# Patient Record
Sex: Male | Born: 1990 | Race: White | Hispanic: No | Marital: Single
Health system: Southern US, Community
[De-identification: ages and names within clinical notes are randomized; demographics above are authoritative.]

---

## 2015-09-03 ENCOUNTER — Emergency Department: Payer: Self-pay

## 2015-09-03 ENCOUNTER — Emergency Department
Admission: EM | Admit: 2015-09-03 | Discharge: 2015-09-03 | Disposition: A | Discharge status: Home / Self Care | Payer: Self-pay | Attending: Emergency Medicine | Admitting: Emergency Medicine

## 2015-09-03 DIAGNOSIS — L0291 Cutaneous abscess, unspecified: Secondary | ICD-10-CM

## 2015-09-03 DIAGNOSIS — F1910 Other psychoactive substance abuse, uncomplicated: Secondary | ICD-10-CM | POA: Insufficient documentation

## 2015-09-03 DIAGNOSIS — L02414 Cutaneous abscess of left upper limb: Secondary | ICD-10-CM | POA: Insufficient documentation

## 2015-09-03 LAB — CBC WITH DIFFERENTIAL/PLATELET
BASOS ABS: 0 10*3/uL (ref 0–0.1)
Basophils Relative: 0 %
Eosinophils Absolute: 0.1 10*3/uL (ref 0–0.7)
Eosinophils Relative: 1 %
HCT: 38.5 % — ABNORMAL LOW (ref 40.0–52.0)
HEMOGLOBIN: 13.2 g/dL (ref 13.0–18.0)
LYMPHS ABS: 3 10*3/uL (ref 1.0–3.6)
LYMPHS PCT: 24 %
MCH: 28.2 pg (ref 26.0–34.0)
MCHC: 34.3 g/dL (ref 32.0–36.0)
MCV: 82.3 fL (ref 80.0–100.0)
Monocytes Absolute: 1 10*3/uL (ref 0.2–1.0)
Monocytes Relative: 8 %
NEUTROS ABS: 8.6 10*3/uL — AB (ref 1.4–6.5)
NEUTROS PCT: 67 %
Platelets: 279 10*3/uL (ref 150–440)
RBC: 4.68 MIL/uL (ref 4.40–5.90)
RDW: 14.7 % — ABNORMAL HIGH (ref 11.5–14.5)
WBC: 12.8 10*3/uL — AB (ref 3.8–10.6)

## 2015-09-03 LAB — COMPREHENSIVE METABOLIC PANEL
ALT: 82 U/L — AB (ref 17–63)
AST: 49 U/L — AB (ref 15–41)
Albumin: 4.2 g/dL (ref 3.5–5.0)
Alkaline Phosphatase: 78 U/L (ref 38–126)
Anion gap: 9 (ref 5–15)
BUN: 11 mg/dL (ref 6–20)
CHLORIDE: 101 mmol/L (ref 101–111)
CO2: 27 mmol/L (ref 22–32)
CREATININE: 1.29 mg/dL — AB (ref 0.61–1.24)
Calcium: 9.1 mg/dL (ref 8.9–10.3)
GFR calc Af Amer: >60 mL/min (ref >60)
Glucose, Bld: 112 mg/dL — ABNORMAL HIGH (ref 65–99)
Potassium: 3.3 mmol/L — ABNORMAL LOW (ref 3.5–5.1)
SODIUM: 137 mmol/L (ref 135–145)
Total Bilirubin: 0.7 mg/dL (ref 0.3–1.2)
Total Protein: 8.2 g/dL — ABNORMAL HIGH (ref 6.5–8.1)

## 2015-09-03 LAB — LACTIC ACID, PLASMA: Lactic Acid, Venous: 1.7 mmol/L (ref 0.5–1.9)

## 2015-09-03 MED ORDER — LIDOCAINE-EPINEPHRINE (PF) 2 %-1:200000 IJ SOLN
20.0000 mL | Freq: Once | INTRAMUSCULAR | Status: DC
  Filled 2015-09-03: qty 20

## 2015-09-03 MED ORDER — LIDOCAINE-EPINEPHRINE 2 %-1:100000 IJ SOLN
20.0000 mL | Freq: Once | INTRAMUSCULAR | Status: DC
Start: 2015-09-03 — End: 2015-09-03
  Filled 2015-09-03: qty 20

## 2015-09-03 MED ORDER — SULFAMETHOXAZOLE-TRIMETHOPRIM 800-160 MG PO TABS
1.0000 | ORAL_TABLET | Freq: Once | ORAL | Status: AC
  Administered 2015-09-03: 1 via ORAL
  Filled 2015-09-03: qty 1

## 2015-09-03 MED ORDER — SODIUM CHLORIDE 0.9 % IV BOLUS (SEPSIS)
1000.0000 mL | Freq: Once | INTRAVENOUS | Status: AC
  Administered 2015-09-03: 1000 mL via INTRAVENOUS

## 2015-09-03 MED ORDER — SULFAMETHOXAZOLE-TRIMETHOPRIM 800-160 MG PO TABS: 1.0000 | ORAL_TABLET | Freq: Two times a day (BID) | ORAL | Status: AC

## 2015-09-03 MED ORDER — LIDOCAINE-EPINEPHRINE (PF) 1 %-1:200000 IJ SOLN
INTRAMUSCULAR | Status: AC
  Administered 2015-09-03: 30 mL
  Filled 2015-09-03: qty 30

## 2015-09-03 NOTE — ED Provider Notes (Signed)
[facility] Emergency Department Provider Note    ____________________________________________  Time seen: ~1605  I have reviewed the triage vital signs and the nursing notes.   HISTORY  Chief Complaint Abscess   History limited by: Not Limited   HPI Basilia JumboMark Mamone is a 25 y.o. male who presents to the emergency department today because of concerns for left arm swelling and redness. The patient says that he shot up in that arm at roughly 1 week ago. Since then the redness and swelling has gotten worse. He does not think that any of the needle broke off. He denies any fevers. Denies any nausea or vomiting. Denies history of abscess in the past. Denies any history of bad infections.  PMH Denies any significant history  There are no active problems to display for this patient.   No past surgical history on file.  Meds Denies any prescription  Allergies Review of patient's allergies indicates no known allergies.  No family history on file.  Social History Positive IV drug use  Review of Systems  Constitutional: Negative for fever. Cardiovascular: Negative for chest pain. Respiratory: Negative for shortness of breath. Gastrointestinal: Negative for abdominal pain, vomiting and diarrhea. Genitourinary: Negative for dysuria. Musculoskeletal: Negative for back pain. Skin: Positive for redness and swelling to the left forearm.  Neurological: Negative for headaches, focal weakness or numbness.  10-point ROS otherwise negative.  ____________________________________________   PHYSICAL EXAM:  VITAL SIGNS: ED Triage Vitals  Enc Vitals Group     BP 09/03/15 1552 135/63 mmHg     Pulse Rate 09/03/15 1552 129     Resp 09/03/15 1552 20     Temp 09/03/15 1552 99 F (37.2 C)     Temp Source 09/03/15 1552 Oral     SpO2 09/03/15 1552 99 %     Weight 09/03/15 1552 120 lb (54.432 kg)     Height 09/03/15 1552 5\' 6"  (1.676 m)     Head Cir --       Peak Flow --      Pain Score 09/03/15 1552 7   Constitutional: Alert and oriented. Well appearing and in no distress. Eyes: Conjunctivae are normal. PERRL. Normal extraocular movements. ENT   Head: Normocephalic and atraumatic.   Nose: No congestion/rhinnorhea.   Mouth/Throat: Mucous membranes are moist.   Neck: No stridor. Hematological/Lymphatic/Immunilogical: No cervical lymphadenopathy. Cardiovascular: Normal rate, regular rhythm.  No murmurs, rubs, or gallops. Respiratory: Normal respiratory effort without tachypnea nor retractions. Breath sounds are clear and equal bilaterally. No wheezes/rales/rhonchi. Gastrointestinal: Soft and nontender. No distention.  Genitourinary: Deferred Musculoskeletal: Left forearm with swelling and redness to mid forearm. Fluctuant area roughly 2-3 cm in diameter. Tender. Radial pulse 2+. NV intact distally. Neurologic:  Normal speech and language. No gross focal neurologic deficits are appreciated.  Skin:  Skin is warm, dry and intact. No rash noted. Psychiatric: Mood and affect are normal. Speech and behavior are normal. Patient exhibits appropriate insight and judgment.  ____________________________________________    LABS (pertinent positives/negatives)  Labs Reviewed  COMPREHENSIVE METABOLIC PANEL - Abnormal; Notable for the following:    Potassium 3.3 (*)    Glucose, Bld 112 (*)    Creatinine, Ser 1.29 (*)    Total Protein 8.2 (*)    AST 49 (*)    ALT 82 (*)    All other components within normal limits  CBC WITH DIFFERENTIAL/PLATELET - Abnormal; Notable for the following:    WBC 12.8 (*)    HCT 38.5 (*)  RDW 14.7 (*)    Neutro Abs 8.6 (*)    All other components within normal limits  CULTURE, BLOOD (ROUTINE X 2)  CULTURE, BLOOD (ROUTINE X 2)  URINE CULTURE  LACTIC ACID, PLASMA  LACTIC ACID, PLASMA  URINALYSIS COMPLETEWITH MICROSCOPIC ([facility] ONLY)     ____________________________________________   EKG  I,  Phineas SemenGraydon Tameron Lama, attending physician, personally viewed and interpreted this EKG  EKG Time: 1605 Rate: 116 Rhythm: sinus tachycardia Axis: normal Intervals: qtc 430 QRS: narrow ST changes: no st elevation Impression: abnormal ekg  ____________________________________________    RADIOLOGY  Left forearm IMPRESSION: Soft tissue swelling without acute bony abnormality.  I, Nyeema Want, personally viewed and evaluated these images (plain radiographs) as part of my medical decision making. ____________________________________________   PROCEDURES  Procedure(s) performed: Incision and drainage, see procedure note(s).  Critical Care performed: No  Incision and Drainage of Abcess Location: left forearm Anesthesia Local: 1% Lidocaine with Epi  Prep/Procedure: Skin Prep: Betadine Incised abscess with #11 blade Purulent discharge: large Probed to break up loculations Packed with 1/4" gauze Estimated blood loss: 1 ml  ____________________________________________   INITIAL IMPRESSION / ASSESSMENT AND PLAN / ED COURSE  Pertinent labs & imaging results that were available during my care of the patient were reviewed by me and considered in my medical decision making (see chart for details).  Patient presented to the emergency department today because of concerns for left forearm swelling and redness. On exam patient does have redness swelling and fluctuance to the left forearm consistent with abscess. Patient is an IV drug user. There is some surrounding cellulitis. Incision and drainage was performed with a large amount of purulent discharge. Will plan on giving patient prescription for antibiotics.  ____________________________________________   FINAL CLINICAL IMPRESSION(S) / ED DIAGNOSES  Final diagnoses:  Abscess  IV drug abuse     Note: This dictation was prepared with Dragon dictation. Any transcriptional errors that result from this process are  unintentional    Phineas SemenGraydon Irania Durell, MD 09/03/15 [id number]

## 2015-09-03 NOTE — ED Notes (Signed)
Pt states that he used his left arm last Friday to "shoot up" pt states the left arm started getting "big 2 days ago" area is very swollen and red

## 2015-09-03 NOTE — Discharge Instructions (Signed)
Please seek medical attention for any high fevers, chest pain, shortness of breath, change in behavior, persistent vomiting, bloody stool or any other new or concerning symptoms. ° ° °Abscess °An abscess (boil or furuncle) is an infected area on or under the skin. This area is filled with yellowish-white fluid (pus) and other material (debris). °HOME CARE  °· Only take medicines as told by your doctor. °· If you were given antibiotic medicine, take it as directed. Finish the medicine even if you start to feel better. °· If gauze is used, follow your doctor's directions for changing the gauze. °· To avoid spreading the infection: °¨ Keep your abscess covered with a bandage. °¨ Wash your hands well. °¨ Do not share personal care items, towels, or whirlpools with others. °¨ Avoid skin contact with others. °· Keep your skin and clothes clean around the abscess. °· Keep all doctor visits as told. °GET HELP RIGHT AWAY IF:  °· You have more pain, puffiness (swelling), or redness in the wound site. °· You have more fluid or blood coming from the wound site. °· You have muscle aches, chills, or you feel sick. °· You have a fever. °MAKE SURE YOU:  °· Understand these instructions. °· Will watch your condition. °· Will get help right away if you are not doing well or get worse. °  °This information is not intended to replace advice given to you by your health care provider. Make sure you discuss any questions you have with your health care provider. °  °Document Released: 08/09/2007 Document Revised: 08/22/2011 Document Reviewed: 05/06/2011 °Elsevier Interactive Patient Education ©2016 [employer] ° °

## 2015-09-03 NOTE — ED Notes (Signed)
Pt discharged to home.  Discharge instructions reviewed.  Verbalized understanding.  No questions or concerns at this time.  Teach back verified.  Pt in NAD.  No items left in ED.   

## 2015-09-08 LAB — CULTURE, BLOOD (ROUTINE X 2)
CULTURE: NO GROWTH
CULTURE: NO GROWTH

## 2017-05-13 IMAGING — DX DG FOREARM 2V*L*
2 series · 2 of 2 positions shown · non-contrast
Comparison: None.

CLINICAL DATA: Recent drug use with swelling and pain of the
forearm, no known injury, initial encounter

EXAM:
LEFT FOREARM - 2 VIEW

[forearm ap]
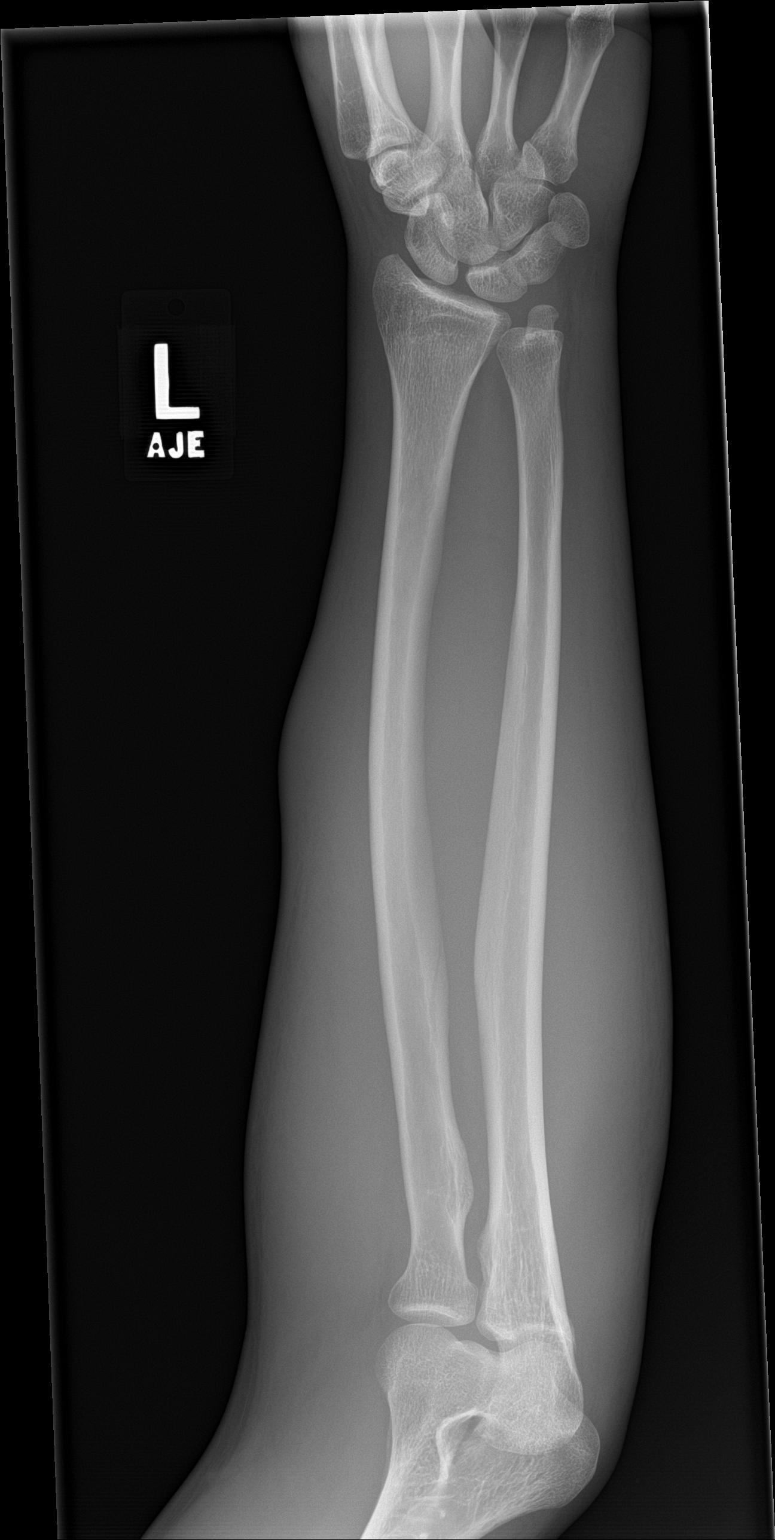

[forearm lat]
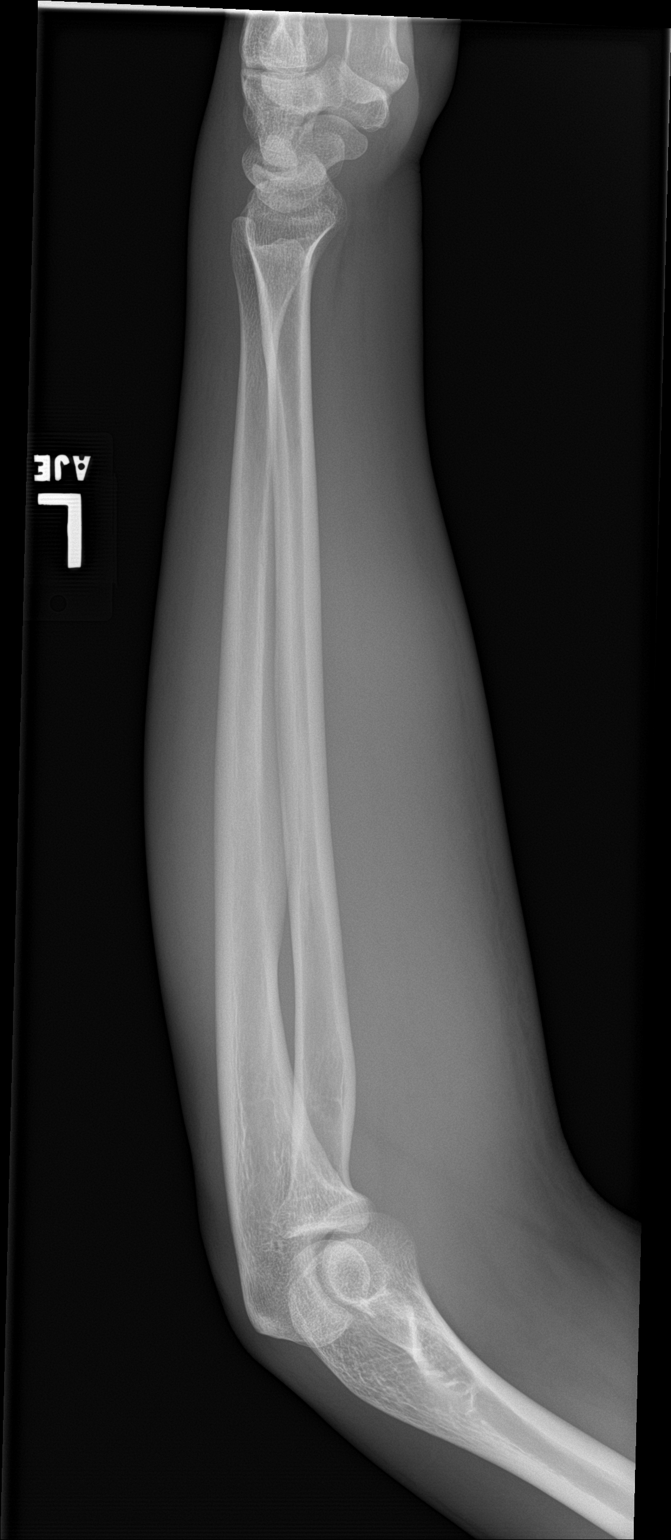

[2 of 2 positions shown; findings below may reference images not displayed]

FINDINGS: Generalized soft tissue swelling is noted in the mid forearm. No
underlying bony abnormality is seen. No radiopaque foreign body is
noted.
IMPRESSION: Soft tissue swelling without acute bony abnormality.

## 2021-11-16 ENCOUNTER — Emergency Department (HOSPITAL_COMMUNITY)
Admission: EM | Admit: 2021-11-16 | Discharge: 2021-11-16 | Disposition: A | Discharge status: Home / Self Care | Payer: Self-pay | Attending: Emergency Medicine | Admitting: Emergency Medicine

## 2021-11-16 ENCOUNTER — Other Ambulatory Visit: Payer: Self-pay

## 2021-11-16 ENCOUNTER — Encounter (HOSPITAL_COMMUNITY): Payer: Self-pay | Admitting: Emergency Medicine

## 2021-11-16 ENCOUNTER — Emergency Department (HOSPITAL_COMMUNITY): Payer: Self-pay

## 2021-11-16 DIAGNOSIS — N200 Calculus of kidney: Secondary | ICD-10-CM | POA: Insufficient documentation

## 2021-11-16 DIAGNOSIS — R001 Bradycardia, unspecified: Secondary | ICD-10-CM | POA: Insufficient documentation

## 2021-11-16 LAB — CBC WITH DIFFERENTIAL/PLATELET
Abs Immature Granulocytes: 0.04 10*3/uL (ref 0.00–0.07)
Basophils Absolute: 0.1 10*3/uL (ref 0.0–0.1)
Basophils Relative: 1 %
Eosinophils Absolute: 0.1 10*3/uL (ref 0.0–0.5)
Eosinophils Relative: 1 %
HCT: 42.9 % (ref 39.0–52.0)
Hemoglobin: 14.7 g/dL (ref 13.0–17.0)
Immature Granulocytes: 0 %
Lymphocytes Relative: 20 %
Lymphs Abs: 2.5 10*3/uL (ref 0.7–4.0)
MCH: 29.1 pg (ref 26.0–34.0)
MCHC: 34.3 g/dL (ref 30.0–36.0)
MCV: 84.8 fL (ref 80.0–100.0)
Monocytes Absolute: 0.7 10*3/uL (ref 0.1–1.0)
Monocytes Relative: 6 %
Neutro Abs: 9.1 10*3/uL — ABNORMAL HIGH (ref 1.7–7.7)
Neutrophils Relative %: 72 %
Platelets: 243 10*3/uL (ref 150–400)
RBC: 5.06 MIL/uL (ref 4.22–5.81)
RDW: 13.2 % (ref 11.5–15.5)
WBC: 12.5 10*3/uL — ABNORMAL HIGH (ref 4.0–10.5)
nRBC: 0 % (ref 0.0–0.2)

## 2021-11-16 LAB — I-STAT CHEM 8, ED
BUN: 13 mg/dL (ref 6–20)
Calcium, Ion: 1.14 mmol/L — ABNORMAL LOW (ref 1.15–1.40)
Chloride: 105 mmol/L (ref 98–111)
Creatinine, Ser: 1.2 mg/dL (ref 0.61–1.24)
Glucose, Bld: 143 mg/dL — ABNORMAL HIGH (ref 70–99)
HCT: 44 % (ref 39.0–52.0)
Hemoglobin: 15 g/dL (ref 13.0–17.0)
Potassium: 3.2 mmol/L — ABNORMAL LOW (ref 3.5–5.1)
Sodium: 139 mmol/L (ref 135–145)
TCO2: 22 mmol/L (ref 22–32)

## 2021-11-16 LAB — COMPREHENSIVE METABOLIC PANEL
ALT: 38 U/L (ref 0–44)
AST: 33 U/L (ref 15–41)
Albumin: 4.3 g/dL (ref 3.5–5.0)
Alkaline Phosphatase: 60 U/L (ref 38–126)
Anion gap: 11 (ref 5–15)
BUN: 12 mg/dL (ref 6–20)
CO2: 22 mmol/L (ref 22–32)
Calcium: 9 mg/dL (ref 8.9–10.3)
Chloride: 104 mmol/L (ref 98–111)
Creatinine, Ser: 1.28 mg/dL — ABNORMAL HIGH (ref 0.61–1.24)
GFR, Estimated: >60 mL/min (ref >60)
Glucose, Bld: 146 mg/dL — ABNORMAL HIGH (ref 70–99)
Potassium: 3 mmol/L — ABNORMAL LOW (ref 3.5–5.1)
Sodium: 137 mmol/L (ref 135–145)
Total Bilirubin: 0.6 mg/dL (ref 0.3–1.2)
Total Protein: 7.1 g/dL (ref 6.5–8.1)

## 2021-11-16 MED ORDER — CEPHALEXIN 500 MG PO CAPS: 500.0000 mg | ORAL_CAPSULE | Freq: Two times a day (BID) | ORAL | 0 refills | Status: AC

## 2021-11-16 MED ORDER — FENTANYL CITRATE PF 50 MCG/ML IJ SOSY
50.0000 ug | PREFILLED_SYRINGE | Freq: Once | INTRAMUSCULAR | Status: AC
  Administered 2021-11-16: 50 ug via INTRAVENOUS
  Filled 2021-11-16: qty 1

## 2021-11-16 MED ORDER — POTASSIUM CHLORIDE ER 10 MEQ PO TBCR: 10.0000 meq | EXTENDED_RELEASE_TABLET | Freq: Every day | ORAL | 0 refills | Status: AC

## 2021-11-16 MED ORDER — IBUPROFEN 800 MG PO TABS: 800.0000 mg | ORAL_TABLET | Freq: Three times a day (TID) | ORAL | 0 refills | Status: AC

## 2021-11-16 MED ORDER — SODIUM CHLORIDE 0.9 % IV BOLUS
1000.0000 mL | Freq: Once | INTRAVENOUS | Status: AC
  Administered 2021-11-16: 1000 mL via INTRAVENOUS

## 2021-11-16 MED ORDER — POTASSIUM CHLORIDE CRYS ER 20 MEQ PO TBCR
40.0000 meq | EXTENDED_RELEASE_TABLET | Freq: Once | ORAL | Status: AC
  Administered 2021-11-16: 40 meq via ORAL
  Filled 2021-11-16: qty 2

## 2021-11-16 MED ORDER — IOHEXOL 350 MG/ML SOLN
80.0000 mL | Freq: Once | INTRAVENOUS | Status: AC | PRN
  Administered 2021-11-16: 80 mL via INTRAVENOUS

## 2021-11-16 MED ORDER — ONDANSETRON 4 MG PO TBDP: 4.0000 mg | ORAL_TABLET | Freq: Three times a day (TID) | ORAL | 0 refills | Status: AC | PRN

## 2021-11-16 NOTE — ED Notes (Signed)
Provider aware of low heart rate, bedside.

## 2021-11-16 NOTE — Discharge Instructions (Addendum)
Your CT showed evidence of a kidney stone.  I'm going to start you on some prophylactic antibiotics because your white blood cell count was slightly elevated.  The CT also showed some mild edema around the liver. You should discuss this with your doctor as you may need additional outpatient testing.   Your potassium was slightly low, we gave you a potassium vitamin and I have given you a prescription for potassium.  Your resting heart rate is quite slow.  You should follow-up with a cardiologist.  I've placed a referral for you and the cardiology office should contact you.

## 2021-11-16 NOTE — ED Notes (Signed)
RN and pt back from CT 

## 2021-11-16 NOTE — ED Triage Notes (Signed)
Pt c/o right groin pain and constipation that woke him from sleep.

## 2021-11-16 NOTE — ED Notes (Signed)
Pt in bed moaning reporting that pain has moved to his back.  He is finding it difficult to be still in the bed.

## 2021-11-16 NOTE — ED Provider Notes (Signed)
[facility]EM[facility]GENC[facility] DEPT [facility] Emergency Department Provider Note MRN:  [id number]  Arrival date & time: 11/16/21     Chief Complaint   Groin Pain   History of Present Illness   Dean Dalton is a 31 y.o. year-old male presents to the ED with chief complaint of right sided groin pain.  Associated flank pain.  Pain awakened him from sleep.  He states that it is severe.  Denies any treatments PTA.  Takes suboxone. Noted to have slow HR.  He denies dizziness, lightheadedness, chest pain, or SOB.  History provided by patient.   Review of Systems  Pertinent positive and negative review of systems noted in HPI.    Physical Exam   Vitals:   11/16/21 0438 11/16/21 0445  BP: 130/68 (!) 155/86  Pulse: (!) 33 (!) 34  Resp: 14 10  Temp:    SpO2: 100% 100%    CONSTITUTIONAL:  well-appearing, NAD NEURO:  Alert and oriented x 3, CN 3-12 grossly intact EYES:  eyes equal and reactive ENT/NECK:  Supple, no stridor  CARDIO:  bradycardia, regular rhythm, appears well-perfused  PULM:  No respiratory distress, CTAB GI/GU:  non-distended,  MSK/SPINE:  No gross deformities, no edema, moves all extremities  SKIN:  no rash, atraumatic   *Additional and/or pertinent findings included in MDM below  Diagnostic and Interventional Summary    EKG Interpretation  Date/Time:  Wednesday November 16 2021 04:23:58 EDT Ventricular Rate:  32 PR Interval:  154 QRS Duration: 109 QT Interval:  545 QTC Calculation: 398 R Axis:   90 Text Interpretation: Sinus bradycardia Borderline right axis deviation Confirmed by Gilda Crease [phone]) on 11/16/2021 4:25:35 AM       Labs Reviewed  CBC WITH DIFFERENTIAL/PLATELET - Abnormal; Notable for the following components:      Result Value   WBC 12.5 (*)    Neutro Abs 9.1 (*)    All other components within normal limits  COMPREHENSIVE METABOLIC PANEL - Abnormal; Notable for the following components:   Potassium 3.0 (*)    Glucose,  Bld 146 (*)    Creatinine, Ser 1.28 (*)    All other components within normal limits  I-STAT CHEM 8, ED - Abnormal; Notable for the following components:   Potassium 3.2 (*)    Glucose, Bld 143 (*)    Calcium, Ion 1.14 (*)    All other components within normal limits    CT ABDOMEN PELVIS W CONTRAST  Final Result      Medications  potassium chloride SA (KLOR-CON M) CR tablet 40 mEq (has no administration in time range)  sodium chloride 0.9 % bolus 1,000 mL (1,000 mLs Intravenous New Bag/Given 11/16/21 0440)  fentaNYL (SUBLIMAZE) injection 50 mcg (50 mcg Intravenous Given 11/16/21 0544)  iohexol (OMNIPAQUE) 350 MG/ML injection 80 mL (80 mLs Intravenous Contrast Given 11/16/21 0525)     Procedures  /  Critical Care Procedures  ED Course and Medical Decision Making  I have reviewed the triage vital signs, the nursing notes, and pertinent available records from the EMR.  Social Determinants Affecting Complexity of Care: Patient has no clinically significant social determinants affecting this chief complaint..   ED Course:    Medical Decision Making Patient here with right flank pain.  Sudden onset and severe.  Concern for KS. Will check labs and CT.  Afebrile.  Non-toxic appearing.  He is noted to be bradycardic to the 30s at rest, this increase to the 40s when I talk to him  in the room.  He continues to deny any chest pain, SOB, dizziness or lightheadedness.  He is noted to be mildly hypokalemic at 3.0.  Will supplement with k-dur.    Patient ambulates with HRs in the mid 60s.  No dizziness.  Will refer to cardiology.    Amount and/or Complexity of Data Reviewed Labs: ordered.    Details: K 3.0, mild nonspecific leukocytosis 12.5. Radiology: ordered. ECG/medicine tests: ordered and independent interpretation performed.    Details: Sinus bradycardia, no heart block, asymptomatic  Risk Prescription drug management.     Consultants: No consultations were needed in caring  for this patient.   Treatment and Plan: I considered admission due to patient's initial presentation, but after considering the examination and diagnostic results, patient will not require admission and can be discharged with outpatient follow-up.  Patient discussed with attending physician, Dr. Blinda Leatherwood, who agrees with plan..  Final Clinical Impressions(s) / ED Diagnoses     ICD-10-CM   1. Kidney stone  N20.0     2. Bradycardia  R00.1 Ambulatory referral to Cardiology      ED Discharge Orders          Ordered    ibuprofen (ADVIL) 800 MG tablet  3 times daily        11/16/21 0627    ondansetron (ZOFRAN-ODT) 4 MG disintegrating tablet  Every 8 hours PRN        11/16/21 6759    Ambulatory referral to Cardiology       Comments: If you have not heard from the Cardiology office within the next 72 hours please call [phone].   11/16/21 3570    cephALEXin (KEFLEX) 500 MG capsule  2 times daily        11/16/21 0631    potassium chloride (KLOR-CON) 10 MEQ tablet  Daily        11/16/21 0631              Discharge Instructions Discussed with and Provided to Patient:     Discharge Instructions      Your CT showed evidence of a kidney stone.  I'm going to start you on some prophylactic antibiotics because your white blood cell count was slightly elevated.  The CT also showed some mild edema around the liver. You should discuss this with your doctor as you may need additional outpatient testing.   Your potassium was slightly low, we gave you a potassium vitamin and I have given you a prescription for potassium.  Your resting heart rate is quite slow.  You should follow-up with a cardiologist.  I've placed a referral for you and the cardiology office should contact you.       Roxy Horseman, PA-C 11/16/21 0631    Gilda Crease, MD 11/17/21 0300

## 2022-07-05 DIAGNOSIS — R0600 Dyspnea, unspecified: Secondary | ICD-10-CM | POA: Diagnosis not present

## 2023-04-05 DIAGNOSIS — Z1159 Encounter for screening for other viral diseases: Secondary | ICD-10-CM | POA: Diagnosis not present

## 2023-04-05 DIAGNOSIS — Z131 Encounter for screening for diabetes mellitus: Secondary | ICD-10-CM | POA: Diagnosis not present

## 2023-04-05 DIAGNOSIS — Z13220 Encounter for screening for lipoid disorders: Secondary | ICD-10-CM | POA: Diagnosis not present

## 2023-04-05 DIAGNOSIS — F1120 Opioid dependence, uncomplicated: Secondary | ICD-10-CM | POA: Diagnosis not present

## 2023-04-05 DIAGNOSIS — Z113 Encounter for screening for infections with a predominantly sexual mode of transmission: Secondary | ICD-10-CM | POA: Diagnosis not present

## 2023-06-14 ENCOUNTER — Ambulatory Visit: Payer: Medicaid Other | Attending: Cardiology | Admitting: Cardiology
